# Patient Record
Sex: Female | Born: 1995 | Race: Black or African American | Hispanic: No | Marital: Single | Smoking: Never smoker
Health system: Southern US, Community
[De-identification: ages and names within clinical notes are randomized; demographics above are authoritative.]

---

## 2015-03-03 ENCOUNTER — Encounter (HOSPITAL_COMMUNITY): Payer: Self-pay | Admitting: *Deleted

## 2015-03-03 ENCOUNTER — Emergency Department (HOSPITAL_COMMUNITY)
Admission: EM | Admit: 2015-03-03 | Discharge: 2015-03-03 | Discharge status: Against Medical Advice | Payer: BLUE CROSS/BLUE SHIELD | Attending: Emergency Medicine | Admitting: Emergency Medicine

## 2015-03-03 DIAGNOSIS — R309 Painful micturition, unspecified: Secondary | ICD-10-CM | POA: Diagnosis present

## 2015-03-03 LAB — PREGNANCY, URINE: Preg Test, Ur: NEGATIVE

## 2015-03-03 LAB — POC URINE PREG, ED: Preg Test, Ur: NEGATIVE

## 2015-03-03 NOTE — ED Notes (Signed)
Vague secription of urinary burning and intermittent blood in urine for 4 days

## 2015-03-03 NOTE — ED Notes (Signed)
Pt called for room placement with no answer. 

## 2016-08-15 ENCOUNTER — Encounter (HOSPITAL_COMMUNITY): Payer: Self-pay

## 2016-08-15 ENCOUNTER — Emergency Department (HOSPITAL_COMMUNITY): Payer: BC Managed Care – PPO

## 2016-08-15 ENCOUNTER — Emergency Department (HOSPITAL_COMMUNITY)
Admission: EM | Admit: 2016-08-15 | Discharge: 2016-08-15 | Disposition: A | Discharge status: Home / Self Care | Payer: BC Managed Care – PPO | Attending: Emergency Medicine | Admitting: Emergency Medicine

## 2016-08-15 DIAGNOSIS — R1010 Upper abdominal pain, unspecified: Secondary | ICD-10-CM

## 2016-08-15 DIAGNOSIS — J069 Acute upper respiratory infection, unspecified: Secondary | ICD-10-CM | POA: Diagnosis not present

## 2016-08-15 DIAGNOSIS — R1012 Left upper quadrant pain: Secondary | ICD-10-CM | POA: Insufficient documentation

## 2016-08-15 LAB — CBC WITH DIFFERENTIAL/PLATELET
BASOS PCT: 0 %
Basophils Absolute: 0 10*3/uL (ref 0.0–0.1)
EOS PCT: 0 %
Eosinophils Absolute: 0 10*3/uL (ref 0.0–0.7)
HCT: 33.7 % — ABNORMAL LOW (ref 36.0–46.0)
Hemoglobin: 11 g/dL — ABNORMAL LOW (ref 12.0–15.0)
LYMPHS PCT: 7 %
Lymphs Abs: 1 10*3/uL (ref 0.7–4.0)
MCH: 26.1 pg (ref 26.0–34.0)
MCHC: 32.6 g/dL (ref 30.0–36.0)
MCV: 79.9 fL (ref 78.0–100.0)
MONO ABS: 1 10*3/uL (ref 0.1–1.0)
Monocytes Relative: 6 %
Neutro Abs: 13.3 10*3/uL — ABNORMAL HIGH (ref 1.7–7.7)
Neutrophils Relative %: 87 %
Platelets: 298 10*3/uL (ref 150–400)
RBC: 4.22 MIL/uL (ref 3.87–5.11)
RDW: 13.1 % (ref 11.5–15.5)
WBC: 15.3 10*3/uL — ABNORMAL HIGH (ref 4.0–10.5)

## 2016-08-15 LAB — POC URINE PREG, ED: Preg Test, Ur: NEGATIVE

## 2016-08-15 LAB — URINE MICROSCOPIC-ADD ON: BACTERIA UA: NONE SEEN

## 2016-08-15 LAB — COMPREHENSIVE METABOLIC PANEL
ALBUMIN: 3.9 g/dL (ref 3.5–5.0)
ALT: 23 U/L (ref 14–54)
AST: 30 U/L (ref 15–41)
Alkaline Phosphatase: 52 U/L (ref 38–126)
Anion gap: 5 (ref 5–15)
BUN: 9 mg/dL (ref 6–20)
CO2: 24 mmol/L (ref 22–32)
CREATININE: 0.72 mg/dL (ref 0.44–1.00)
Calcium: 8.9 mg/dL (ref 8.9–10.3)
Chloride: 108 mmol/L (ref 101–111)
GFR calc Af Amer: >60 mL/min (ref >60)
GFR calc non Af Amer: >60 mL/min (ref >60)
GLUCOSE: 100 mg/dL — AB (ref 65–99)
Potassium: 3.8 mmol/L (ref 3.5–5.1)
Sodium: 137 mmol/L (ref 135–145)
Total Bilirubin: 0.6 mg/dL (ref 0.3–1.2)
Total Protein: 7 g/dL (ref 6.5–8.1)

## 2016-08-15 LAB — URINALYSIS, ROUTINE W REFLEX MICROSCOPIC
Bilirubin Urine: NEGATIVE
GLUCOSE, UA: NEGATIVE mg/dL
Hgb urine dipstick: NEGATIVE
Ketones, ur: NEGATIVE mg/dL
NITRITE: NEGATIVE
PROTEIN: NEGATIVE mg/dL
Specific Gravity, Urine: 1.03 (ref 1.005–1.030)
pH: 6.5 (ref 5.0–8.0)

## 2016-08-15 LAB — LIPASE, BLOOD: LIPASE: 21 U/L (ref 11–51)

## 2016-08-15 MED ORDER — GUAIFENESIN 100 MG/5ML PO LIQD: 100.0000 mg | ORAL | 0 refills | Status: DC | PRN

## 2016-08-15 MED ORDER — ONDANSETRON HCL 4 MG/2ML IJ SOLN
4.0000 mg | Freq: Once | INTRAMUSCULAR | Status: AC
  Administered 2016-08-15: 4 mg via INTRAVENOUS
  Filled 2016-08-15: qty 2

## 2016-08-15 MED ORDER — SODIUM CHLORIDE 0.9 % IV BOLUS (SEPSIS)
1000.0000 mL | Freq: Once | INTRAVENOUS | Status: AC
  Administered 2016-08-15: 1000 mL via INTRAVENOUS

## 2016-08-15 MED ORDER — BENZONATATE 100 MG PO CAPS: 100.0000 mg | ORAL_CAPSULE | Freq: Three times a day (TID) | ORAL | 0 refills | Status: DC

## 2016-08-15 MED ORDER — IOPAMIDOL (ISOVUE-300) INJECTION 61%
100.0000 mL | Freq: Once | INTRAVENOUS | Status: AC | PRN
  Administered 2016-08-15: 100 mL via INTRAVENOUS

## 2016-08-15 NOTE — ED Triage Notes (Signed)
She c/o uri sx for past few days--reports generalized abd. Discomfort and vomiting this morning. She is in no distress.

## 2016-08-15 NOTE — ED Notes (Signed)
Patient reports nausea improved but not relieved entirely.

## 2016-08-15 NOTE — ED Provider Notes (Signed)
[facility]-EMERGENCY DEPT Provider Note   CSN: [id number] Arrival date & time: 08/15/16  [id number]     History   Chief Complaint Chief Complaint  Patient presents with  . URI  . Emesis    HPI Brandi Becker is a 20 y.o. female.  HPI   20 year old female presents with complaints of abdominal pain. Patient reports yesterday she had cold symptoms including nasal congestion, throat irritation, occasional nonproductive cough. She took over-the-counter medication and went to work. Today her symptoms  worsened most specifically having new abdominal pain. She described pain as a "feels like a ball in my abdomen" persistent, moderate in severity with associated nausea and has vomited once. Symptom seems to be improving. She denies having any associated fever, chills, headache, neck stiffness, chest pain, shortness of breath, productive cough, hemoptysis, back pain, dysuria, hematuria, vaginal bleeding, vaginal discharge or rash. No prior abdominal surgery. Her last menstrual period was September 13.  No past medical history on file.  There are no active problems to display for this patient.   No past surgical history on file.  OB History    No data available       Home Medications    Prior to Admission medications   Not on File    Family History No family history on file.  Social History Social History  Substance Use Topics  . Smoking status: Never Smoker  . Smokeless tobacco: Never Used  . Alcohol use Yes     Allergies   Review of patient's allergies indicates no known allergies.   Review of Systems Review of Systems  All other systems reviewed and are negative.    Physical Exam Updated Vital Signs BP 103/64 (BP Location: Left Arm)   Pulse 79   Temp 99.3 F (37.4 C) (Oral)   Resp 15   LMP 07/22/2016 (Approximate)   SpO2 97%   Physical Exam  Constitutional: She appears well-developed and well-nourished. No distress.  HENT:  Head: Atraumatic.  Right Ear:  External ear normal.  Left Ear: External ear normal.  Nose: Nose normal.  Mouth/Throat: Oropharynx is clear and moist.  Eyes: Conjunctivae are normal.  Neck: Normal range of motion. Neck supple.  No nuchal rigidity  Cardiovascular: Normal rate and regular rhythm.   Pulmonary/Chest: Effort normal and breath sounds normal.  Abdominal: Soft. Bowel sounds are normal. She exhibits no distension. There is tenderness (Mild epigastric and left upper quadrant tenderness on palpation without guarding or rebound tenderness. Negative Murphy sign, no pain at McBurney's point.).  Neurological: She is alert.  Skin: No rash noted.  Psychiatric: She has a normal mood and affect.  Nursing note and vitals reviewed.    ED Treatments / Results  Labs (all labs ordered are listed, but only abnormal results are displayed) Labs Reviewed  CBC WITH DIFFERENTIAL/PLATELET - Abnormal; Notable for the following:       Result Value   WBC 15.3 (*)    Hemoglobin 11.0 (*)    HCT 33.7 (*)    Neutro Abs 13.3 (*)    All other components within normal limits  URINALYSIS, ROUTINE W REFLEX MICROSCOPIC (NOT AT [facility]) - Abnormal; Notable for the following:    Color, Urine AMBER (*)    Leukocytes, UA SMALL (*)    All other components within normal limits  COMPREHENSIVE METABOLIC PANEL - Abnormal; Notable for the following:    Glucose, Bld 100 (*)    All other components within normal limits  URINE MICROSCOPIC-ADD ON - Abnormal;  Notable for the following:    Squamous Epithelial / LPF 6-30 (*)    All other components within normal limits  LIPASE, BLOOD  POC URINE PREG, ED    EKG  EKG Interpretation None       Radiology Ct Abdomen Pelvis W Contrast  Result Date: 08/15/2016 CLINICAL DATA:  Generalized abdominal pain and vomiting for several days. Leukocytosis. EXAM: CT ABDOMEN AND PELVIS WITH CONTRAST TECHNIQUE: Multidetector CT imaging of the abdomen and pelvis was performed using the standard protocol following  bolus administration of intravenous contrast. CONTRAST:  ISOVUE-300 IOPAMIDOL (ISOVUE-300) INJECTION 61% COMPARISON:  None. FINDINGS: Lower Chest: No acute findings. Hepatobiliary:  No mass identified. Gallbladder is unremarkable. Pancreas:  No mass or inflammatory changes. Spleen: Within normal limits in size and appearance. Adrenals/Urinary Tract: No masses identified. No evidence of hydronephrosis. Stomach/Bowel: No evidence of obstruction, inflammatory process or abnormal fluid collections. Normal appendix visualized. Vascular/Lymphatic: No pathologically enlarged lymph nodes. No abdominal aortic aneurysm. Reproductive: A 5 cm fibroid is seen in the left uterine corpus with a submucosal component displacing the endometrium to the right. Both ovaries are normal in appearance. No adnexal mass identified. Trace amount of free fluid noted in pelvic cul-de-sac. Other:  None. Musculoskeletal:  No suspicious bone lesions identified. IMPRESSION: No acute findings identified within the abdomen or pelvis. 5 cm uterine fibroid. Electronically Signed   By: Myles Rosenthal M.D.   On: 08/15/2016 11:05    Procedures Procedures (including critical care time)  Medications Ordered in ED Medications  sodium chloride 0.9 % bolus 1,000 mL (1,000 mLs Intravenous New Bag/Given 08/15/16 0752)  ondansetron (ZOFRAN) injection 4 mg (4 mg Intravenous Given 08/15/16 0754)     Initial Impression / Assessment and Plan / ED Course  I have reviewed the triage vital signs and the nursing notes.  Pertinent labs & imaging results that were available during my care of the patient were reviewed by me and considered in my medical decision making (see chart for details).  Clinical Course    BP (!) 92/54   Pulse 78   Temp 99.3 F (37.4 C) (Oral)   Resp 15   LMP 07/22/2016 (Approximate)   SpO2 100%    Final Clinical Impressions(s) / ED Diagnoses   Final diagnoses:  Viral upper respiratory tract infection  Pain of upper  abdomen    New Prescriptions New Prescriptions   BENZONATATE (TESSALON) 100 MG CAPSULE    Take 1 capsule (100 mg total) by mouth every 8 (eight) hours.   GUAIFENESIN (ROBITUSSIN) 100 MG/5ML LIQUID    Take 5-10 mLs (100-200 mg total) by mouth every 4 (four) hours as needed for cough.   9:52 AM Patient presents with cold symptoms now having abdominal pain. She does have mild upper abdominal tenderness on exam but she has a nonsurgical abdomen. Patient otherwise well-appearing. Labs remarkable for an elevated WBC of 15.3. Although this is likely reactive, I will obtain abdominal and pelvis CT scan to rule out acute abdominal pathology. Have low suspicion for pneumonia.  12:25 PM Abdominal and pelvis CT scan shows no acute abnormality. Patient is stable and feeling comfortable to be discharged. Will provide symptomatic treatment for her symptom. Patient understands to return if her condition worsened.    Fayrene Helper, PA-C 08/15/16 1229    Marily Memos, MD 08/15/16 [phone]

## 2016-11-26 ENCOUNTER — Encounter (HOSPITAL_COMMUNITY): Payer: Self-pay | Admitting: Emergency Medicine

## 2016-11-26 ENCOUNTER — Emergency Department (HOSPITAL_COMMUNITY): Payer: BC Managed Care – PPO

## 2016-11-26 ENCOUNTER — Emergency Department (HOSPITAL_COMMUNITY)
Admission: EM | Admit: 2016-11-26 | Discharge: 2016-11-26 | Disposition: A | Discharge status: Home / Self Care | Payer: BC Managed Care – PPO | Attending: Emergency Medicine | Admitting: Emergency Medicine

## 2016-11-26 DIAGNOSIS — Y939 Activity, unspecified: Secondary | ICD-10-CM | POA: Insufficient documentation

## 2016-11-26 DIAGNOSIS — Y92410 Unspecified street and highway as the place of occurrence of the external cause: Secondary | ICD-10-CM | POA: Insufficient documentation

## 2016-11-26 DIAGNOSIS — Y999 Unspecified external cause status: Secondary | ICD-10-CM | POA: Insufficient documentation

## 2016-11-26 DIAGNOSIS — S8002XA Contusion of left knee, initial encounter: Secondary | ICD-10-CM | POA: Diagnosis not present

## 2016-11-26 DIAGNOSIS — S8992XA Unspecified injury of left lower leg, initial encounter: Secondary | ICD-10-CM | POA: Diagnosis present

## 2016-11-26 MED ORDER — IBUPROFEN 600 MG PO TABS: 600.0000 mg | ORAL_TABLET | Freq: Four times a day (QID) | ORAL | 0 refills | Status: DC | PRN

## 2016-11-26 MED ORDER — CYCLOBENZAPRINE HCL 10 MG PO TABS: 5.0000 mg | ORAL_TABLET | Freq: Two times a day (BID) | ORAL | 0 refills | Status: DC | PRN
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
## 2017-08-25 ENCOUNTER — Emergency Department (HOSPITAL_COMMUNITY)
Admission: EM | Admit: 2017-08-25 | Discharge: 2017-08-26 | Disposition: A | Discharge status: Home / Self Care | Payer: BC Managed Care – PPO | Attending: Emergency Medicine | Admitting: Emergency Medicine

## 2017-08-25 ENCOUNTER — Encounter (HOSPITAL_COMMUNITY): Payer: Self-pay

## 2017-08-25 DIAGNOSIS — R1030 Lower abdominal pain, unspecified: Secondary | ICD-10-CM | POA: Diagnosis present

## 2017-08-25 DIAGNOSIS — N390 Urinary tract infection, site not specified: Secondary | ICD-10-CM | POA: Diagnosis not present

## 2017-08-25 DIAGNOSIS — Z7982 Long term (current) use of aspirin: Secondary | ICD-10-CM | POA: Insufficient documentation

## 2017-08-25 LAB — COMPREHENSIVE METABOLIC PANEL
ALBUMIN: 3.9 g/dL (ref 3.5–5.0)
ALT: 11 U/L — AB (ref 14–54)
AST: 18 U/L (ref 15–41)
Alkaline Phosphatase: 62 U/L (ref 38–126)
Anion gap: 10 (ref 5–15)
BILIRUBIN TOTAL: 0.3 mg/dL (ref 0.3–1.2)
BUN: 9 mg/dL (ref 6–20)
CO2: 25 mmol/L (ref 22–32)
CREATININE: 0.82 mg/dL (ref 0.44–1.00)
Calcium: 9.1 mg/dL (ref 8.9–10.3)
Chloride: 104 mmol/L (ref 101–111)
GFR calc Af Amer: >60 mL/min (ref >60)
GLUCOSE: 94 mg/dL (ref 65–99)
POTASSIUM: 3.8 mmol/L (ref 3.5–5.1)
Sodium: 139 mmol/L (ref 135–145)
Total Protein: 7.6 g/dL (ref 6.5–8.1)

## 2017-08-25 LAB — URINALYSIS, ROUTINE W REFLEX MICROSCOPIC
Bilirubin Urine: NEGATIVE
Glucose, UA: NEGATIVE mg/dL
Ketones, ur: NEGATIVE mg/dL
NITRITE: NEGATIVE
PH: 7 (ref 5.0–8.0)
Protein, ur: NEGATIVE mg/dL
Specific Gravity, Urine: 1.006 (ref 1.005–1.030)

## 2017-08-25 LAB — CBC
HEMATOCRIT: 37.6 % (ref 36.0–46.0)
Hemoglobin: 12.3 g/dL (ref 12.0–15.0)
MCH: 26.6 pg (ref 26.0–34.0)
MCHC: 32.7 g/dL (ref 30.0–36.0)
MCV: 81.2 fL (ref 78.0–100.0)
Platelets: 370 10*3/uL (ref 150–400)
RBC: 4.63 MIL/uL (ref 3.87–5.11)
RDW: 13.2 % (ref 11.5–15.5)
WBC: 8.9 10*3/uL (ref 4.0–10.5)

## 2017-08-25 LAB — I-STAT BETA HCG BLOOD, ED (MC, WL, AP ONLY): I-stat hCG, quantitative: <5 m[IU]/mL (ref <5)

## 2017-08-25 LAB — LIPASE, BLOOD: Lipase: 26 U/L (ref 11–51)

## 2017-08-25 MED ORDER — SODIUM CHLORIDE 0.9 % IV BOLUS (SEPSIS)
1000.0000 mL | Freq: Once | INTRAVENOUS | Status: AC
  Administered 2017-08-26: 1000 mL via INTRAVENOUS

## 2017-08-25 MED ORDER — ONDANSETRON HCL 4 MG/2ML IJ SOLN
4.0000 mg | Freq: Once | INTRAMUSCULAR | Status: AC
  Administered 2017-08-26: 4 mg via INTRAVENOUS
  Filled 2017-08-25: qty 2

## 2017-08-25 MED ORDER — MORPHINE SULFATE (PF) 4 MG/ML IV SOLN
4.0000 mg | Freq: Once | INTRAVENOUS | Status: AC
  Administered 2017-08-26: 4 mg via INTRAVENOUS
  Filled 2017-08-25: qty 1

## 2017-08-25 MED ORDER — DEXTROSE 5 % IV SOLN
1.0000 g | Freq: Once | INTRAVENOUS | Status: AC
  Administered 2017-08-26: 1 g via INTRAVENOUS
  Filled 2017-08-25: qty 10

## 2017-08-25 NOTE — ED Provider Notes (Signed)
[facility]-EMERGENCY DEPT Provider Note   CSN: [id number] Arrival date & time: 08/25/17  1702     History   Chief Complaint Chief Complaint  Patient presents with  . Fever  . Abdominal Pain  . Generalized Body Aches    HPI Brandi Becker is a 21 y.o. female.  Patient presents to the emergency department with chief complaint of lower abdominal pain. She states the symptoms started earlier today. She states the pain is over her bladder. She also reports some pain with urination. She reports associated fevers and nausea and vomiting. She took some aspirin earlier with minimal relief. She denies any diarrhea. Denies any prior abdominal surgeries. Denies any vaginal discharge or bleeding. Denies any other associated symptoms. There are no modifying factors.   The history is provided by the patient. No language interpreter was used.    History reviewed. No pertinent past medical history.  There are no active problems to display for this patient.   History reviewed. No pertinent surgical history.  OB History    No data available       Home Medications    Prior to Admission medications   Medication Sig Start Date End Date Taking? Authorizing Provider  aspirin EC 325 MG tablet Take 975 mg by mouth daily as needed (abdominal pain, fever).   Yes [provider]    Family History History reviewed. No pertinent family history.  Social History Social History  Substance Use Topics  . Smoking status: Never Smoker  . Smokeless tobacco: Never Used  . Alcohol use Yes     Comment: socially     Allergies   Patient has no known allergies.   Review of Systems Review of Systems  All other systems reviewed and are negative.    Physical Exam Updated Vital Signs BP 116/72 (BP Location: Left Arm)   Pulse 84   Temp 98.7 F (37.1 C) (Oral)   Resp 18   Ht 5\' 2"  (1.575 m)   Wt 65.8 kg (145 lb)   LMP 08/25/2017   SpO2 99%   BMI 26.52 kg/m     Physical Exam  Constitutional: She is oriented to person, place, and time. She appears well-developed and well-nourished.  HENT:  Head: Normocephalic and atraumatic.  Eyes: Pupils are equal, round, and reactive to light. Conjunctivae and EOM are normal.  Neck: Normal range of motion. Neck supple.  Cardiovascular: Normal rate and regular rhythm.  Exam reveals no gallop and no friction rub.   No murmur heard. Pulmonary/Chest: Effort normal and breath sounds normal. No respiratory distress. She has no wheezes. She has no rales. She exhibits no tenderness.  Abdominal: Soft. Bowel sounds are normal. She exhibits no distension and no mass. There is no tenderness. There is no rebound and no guarding.  Musculoskeletal: Normal range of motion. She exhibits no edema or tenderness.  Neurological: She is alert and oriented to person, place, and time.  Skin: Skin is warm and dry.  Psychiatric: She has a normal mood and affect. Her behavior is normal. Judgment and thought content normal.  Nursing note and vitals reviewed.    ED Treatments / Results  Labs (all labs ordered are listed, but only abnormal results are displayed) Labs Reviewed  COMPREHENSIVE METABOLIC PANEL - Abnormal; Notable for the following:       Result Value   ALT 11 (*)    All other components within normal limits  LIPASE, BLOOD  CBC  URINALYSIS, ROUTINE W REFLEX  MICROSCOPIC  I-STAT BETA HCG BLOOD, ED ([facility], [facility], [facility] ONLY)    EKG  EKG Interpretation None       Radiology No results found.  Procedures Procedures (including critical care time)  Medications Ordered in ED Medications - No data to display   Initial Impression / Assessment and Plan / ED Course  I have reviewed the triage vital signs and the nursing notes.  Pertinent labs & imaging results that were available during my care of the patient were reviewed by me and considered in my medical decision making (see chart for details).     Patient with  lower abdominal pain and some pain with urination. Associated fever, nausea, and vomiting. I will check urinalysis. Laboratory workup is reassuring. HCG is negative.  Does have some suprapubic tenderness.  If UA is negative, will consider CT to rule out appy.  UA is consistent with infection. Will treat for UTI. Return precautions given. Patient understands agrees the plan. She feels improved after medications. She is stable and ready for discharge.  Final Clinical Impressions(s) / ED Diagnoses   Final diagnoses:  Urinary tract infection without hematuria, site unspecified    New Prescriptions New Prescriptions   CEPHALEXIN (KEFLEX) 500 MG CAPSULE    Take 1 capsule (500 mg total) by mouth 2 (two) times daily.     Roxy HorsemanBrowning, Kani Chauvin, PA-C 08/26/17 [id number]    Linwood DibblesKnapp, Jon, MD 08/26/17 1336

## 2017-08-25 NOTE — ED Triage Notes (Signed)
Patient reports a fever of 101.0 orally at 1000 today. Patient states she took aspirin for her headache. Patient also c/o body aches, ringing of her ears and abdominal pain.

## 2017-08-26 MED ORDER — CEPHALEXIN 500 MG PO CAPS: 500.0000 mg | ORAL_CAPSULE | Freq: Two times a day (BID) | ORAL | 0 refills | Status: AC
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
